# Patient Record
Sex: Female | Born: 1995 | Race: Black or African American | Hispanic: No | Marital: Married | Smoking: Never smoker
Health system: Southern US, Community
[De-identification: ages and names within clinical notes are randomized; demographics above are authoritative.]

## PROBLEM LIST (undated history)

## (undated) DIAGNOSIS — G43909 Migraine, unspecified, not intractable, without status migrainosus: Secondary | ICD-10-CM

## (undated) HISTORY — PX: WISDOM TOOTH EXTRACTION: SHX21

---
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
## 2017-02-23 ENCOUNTER — Encounter (HOSPITAL_COMMUNITY): Payer: Self-pay | Admitting: Emergency Medicine

## 2017-02-23 ENCOUNTER — Emergency Department (HOSPITAL_COMMUNITY): Payer: Self-pay

## 2017-02-23 ENCOUNTER — Emergency Department (HOSPITAL_COMMUNITY)
Admission: EM | Admit: 2017-02-23 | Discharge: 2017-02-23 | Disposition: A | Discharge status: Home / Self Care | Payer: Self-pay | Attending: Emergency Medicine | Admitting: Emergency Medicine

## 2017-02-23 DIAGNOSIS — J069 Acute upper respiratory infection, unspecified: Secondary | ICD-10-CM | POA: Insufficient documentation

## 2017-02-23 DIAGNOSIS — R079 Chest pain, unspecified: Secondary | ICD-10-CM | POA: Insufficient documentation

## 2017-02-23 LAB — CBC
HEMATOCRIT: 37.7 % (ref 36.0–46.0)
Hemoglobin: 12.6 g/dL (ref 12.0–15.0)
MCH: 26.3 pg (ref 26.0–34.0)
MCHC: 33.4 g/dL (ref 30.0–36.0)
MCV: 78.7 fL (ref 78.0–100.0)
Platelets: 213 10*3/uL (ref 150–400)
RBC: 4.79 MIL/uL (ref 3.87–5.11)
RDW: 12.8 % (ref 11.5–15.5)
WBC: 5.7 10*3/uL (ref 4.0–10.5)

## 2017-02-23 LAB — I-STAT TROPONIN, ED: TROPONIN I, POC: 0 ng/mL (ref 0.00–0.08)

## 2017-02-23 LAB — D-DIMER, QUANTITATIVE (NOT AT ARMC): D DIMER QUANT: 0.39 ug{FEU}/mL (ref 0.00–0.50)

## 2017-02-23 MED ORDER — NAPROXEN 500 MG PO TABS: 500.0000 mg | ORAL_TABLET | Freq: Two times a day (BID) | ORAL | 0 refills | Status: AC

## 2017-02-23 NOTE — ED Triage Notes (Signed)
Patient reports persistent central chest pain with SOB , nausea/emesis , chest congestion and dry cough onset Saturday this week .

## 2017-02-23 NOTE — ED Notes (Signed)
Patient transported to X-ray 

## 2017-02-23 NOTE — ED Provider Notes (Signed)
[facility]-EMERGENCY DEPT Provider Note   CSN: [id number] Arrival date & time: 02/23/17  [id number]     History   Chief Complaint Chief Complaint  Patient presents with  . Chest Pain    HPI Jacqueline Bryant is a 21 y.o. female.  Patient is a 21 year old female with no significant past medical history who presents with chest pain. She states she's had a cough for the last 5 days. It's nonproductive. She states she's been coughing so much that she's had some posttussive emesis as well. She gets short of breath during coughing spells but no shortness of breath on ambulation. She has soreness across both sides of her chest and around to her back. She states it's worse with coughing and moving. She denies any known fevers. She does have a little bit of swelling around her left ankle which she says has been intermittent over the last several weeks. No calf pain. No recent immobilization. She has an IUD in place. She denies any known fevers. She's been using NyQuil without improvement in symptoms.      Past Medical History:  Diagnosis Date  . Migraine     Patient Active Problem List   Diagnosis Date Noted  . General counselling and advice on contraception 09/20/2013  . Well woman exam 07/09/2013  . Nexplanon in place 07/09/2013  . Family history of breast cancer 07/09/2013  . Abnormal uterine bleeding (AUB) 07/09/2013  . Anemia 07/09/2013    Past Surgical History:  Procedure Laterality Date  . WISDOM TOOTH EXTRACTION      OB History    No data available       Home Medications    Prior to Admission medications   Medication Sig Start Date End Date Taking? Authorizing Provider  ibuprofen (ADVIL,MOTRIN) 200 MG tablet Take 200 mg by mouth every 6 (six) hours as needed. Pain    [provider]  naproxen (NAPROSYN) 500 MG tablet Take 1 tablet (500 mg total) by mouth 2 (two) times daily. 02/23/17   Rolan BuccoBelfi, Jashley Yellin, MD  norgestimate-ethinyl estradiol (SPRINTEC 28) 0.25-35  MG-MCG tablet Take 1 tablet by mouth daily. 12/10/13   Lanice ShirtsWren, Amy H, CNM    Family History Family History  Problem Relation Age of Onset  . Cancer Maternal Grandmother   . Breast cancer Maternal Grandmother     Social History Social History  Substance Use Topics  . Smoking status: Never Smoker  . Smokeless tobacco: Never Used  . Alcohol use No     Allergies   Patient has no known allergies.   Review of Systems Review of Systems  Constitutional: Negative for chills, diaphoresis, fatigue and fever.  HENT: Negative for congestion, rhinorrhea and sneezing.   Eyes: Negative.   Respiratory: Positive for cough and shortness of breath. Negative for chest tightness.   Cardiovascular: Positive for chest pain and leg swelling.  Gastrointestinal: Negative for abdominal pain, blood in stool, diarrhea, nausea and vomiting.  Genitourinary: Negative for difficulty urinating, flank pain, frequency and hematuria.  Musculoskeletal: Positive for back pain. Negative for arthralgias.  Skin: Negative for rash.  Neurological: Negative for dizziness, speech difficulty, weakness, numbness and headaches.     Physical Exam Updated Vital Signs BP 106/73 (BP Location: Right Arm)   Pulse 80   Temp 98 F (36.7 C) (Oral)   Resp 20   Ht 5\' 3"  (1.6 m)   Wt 63.5 kg (140 lb)   LMP 12/24/2016 Comment: pt states that she does not know why her menstrual  cycle is two months behind, no chance of pregnancy   SpO2 100%   BMI 24.80 kg/m   Physical Exam  Constitutional: She is oriented to person, place, and time. She appears well-developed and well-nourished.  HENT:  Head: Normocephalic and atraumatic.  Eyes: Pupils are equal, round, and reactive to light.  Neck: Normal range of motion. Neck supple.  Cardiovascular: Normal rate, regular rhythm and normal heart sounds.   Pulmonary/Chest: Effort normal and breath sounds normal. No respiratory distress. She has no wheezes. She has no rales. She exhibits  tenderness (Some reproducible tenderness to the center of the chest).  Abdominal: Soft. Bowel sounds are normal. There is no tenderness. There is no rebound and no guarding.  Musculoskeletal: Normal range of motion. She exhibits edema (Mild nonpitting edema around the left ankle. There is no warmth or erythema. No bony tenderness.).  Lymphadenopathy:    She has no cervical adenopathy.  Neurological: She is alert and oriented to person, place, and time.  Skin: Skin is warm and dry. No rash noted.  Psychiatric: She has a normal mood and affect.     ED Treatments / Results  Labs (all labs ordered are listed, but only abnormal results are displayed) Labs Reviewed  CBC  D-DIMER, QUANTITATIVE (NOT AT [facility])  I-STAT TROPOININ, ED    EKG  EKG Interpretation  Date/Time:  Thursday Feb 23 2017 06:51:08 EDT Ventricular Rate:  76 PR Interval:    QRS Duration: 80 QT Interval:  374 QTC Calculation: 421 R Axis:   35 Text Interpretation:  Sinus rhythm No old tracing to compare Confirmed by Rolan Bucco [phone]) on 02/23/2017 7:06:23 AM       Radiology Dg Chest 2 View  Result Date: 02/23/2017 CLINICAL DATA:  21 year old female with a history of chest pain and back pain EXAM: CHEST  2 VIEW COMPARISON:  08/07/2012 FINDINGS: The heart size and mediastinal contours are within normal limits. Both lungs are clear. The visualized skeletal structures are unremarkable. IMPRESSION: No radiographic evidence of acute cardiopulmonary disease Electronically Signed   By: Gilmer Mor D.O.   On: 02/23/2017 07:27    Procedures Procedures (including critical care time)  Medications Ordered in ED Medications - No data to display   Initial Impression / Assessment and Plan / ED Course  I have reviewed the triage vital signs and the nursing notes.  Pertinent labs & imaging results that were available during my care of the patient were reviewed by me and considered in my medical decision making (see chart  for details).     Patient presents with a cough and associated chest pain. Chest pain is reproducible. It does not sound cardiac in nature. She had some slight swelling of her ankle which sounds more chronic but given this, I did check a d-dimer which was normal. She has no hypoxia. No persistent tachycardia. There is no other suggestions of pulmonary embolus. There is no evidence of pneumonia. Her lungs are clear to auscultation. This is likely a viral URI with associated cough and chest pain related to that. She was encouraged to use Mucinex. She was given a prescription for Naprosyn for pain. Return precautions were given. She was encouraged to follow-up with her PCP if her symptoms are not improving.  Final Clinical Impressions(s) / ED Diagnoses   Final diagnoses:  Nonspecific chest pain  Viral URI    New Prescriptions New Prescriptions   NAPROXEN (NAPROSYN) 500 MG TABLET    Take 1 tablet (500 mg total)  by mouth 2 (two) times daily.     Rolan Bucco, MD 02/23/17 [phone]

## 2018-04-10 IMAGING — CR DG CHEST 2V
2 series · 2 of 2 positions shown · non-contrast
Comparison: 08/07/2012

CLINICAL DATA: 21-year-old female with a history of chest pain and
back pain

EXAM:
CHEST  2 VIEW

[chest pa]
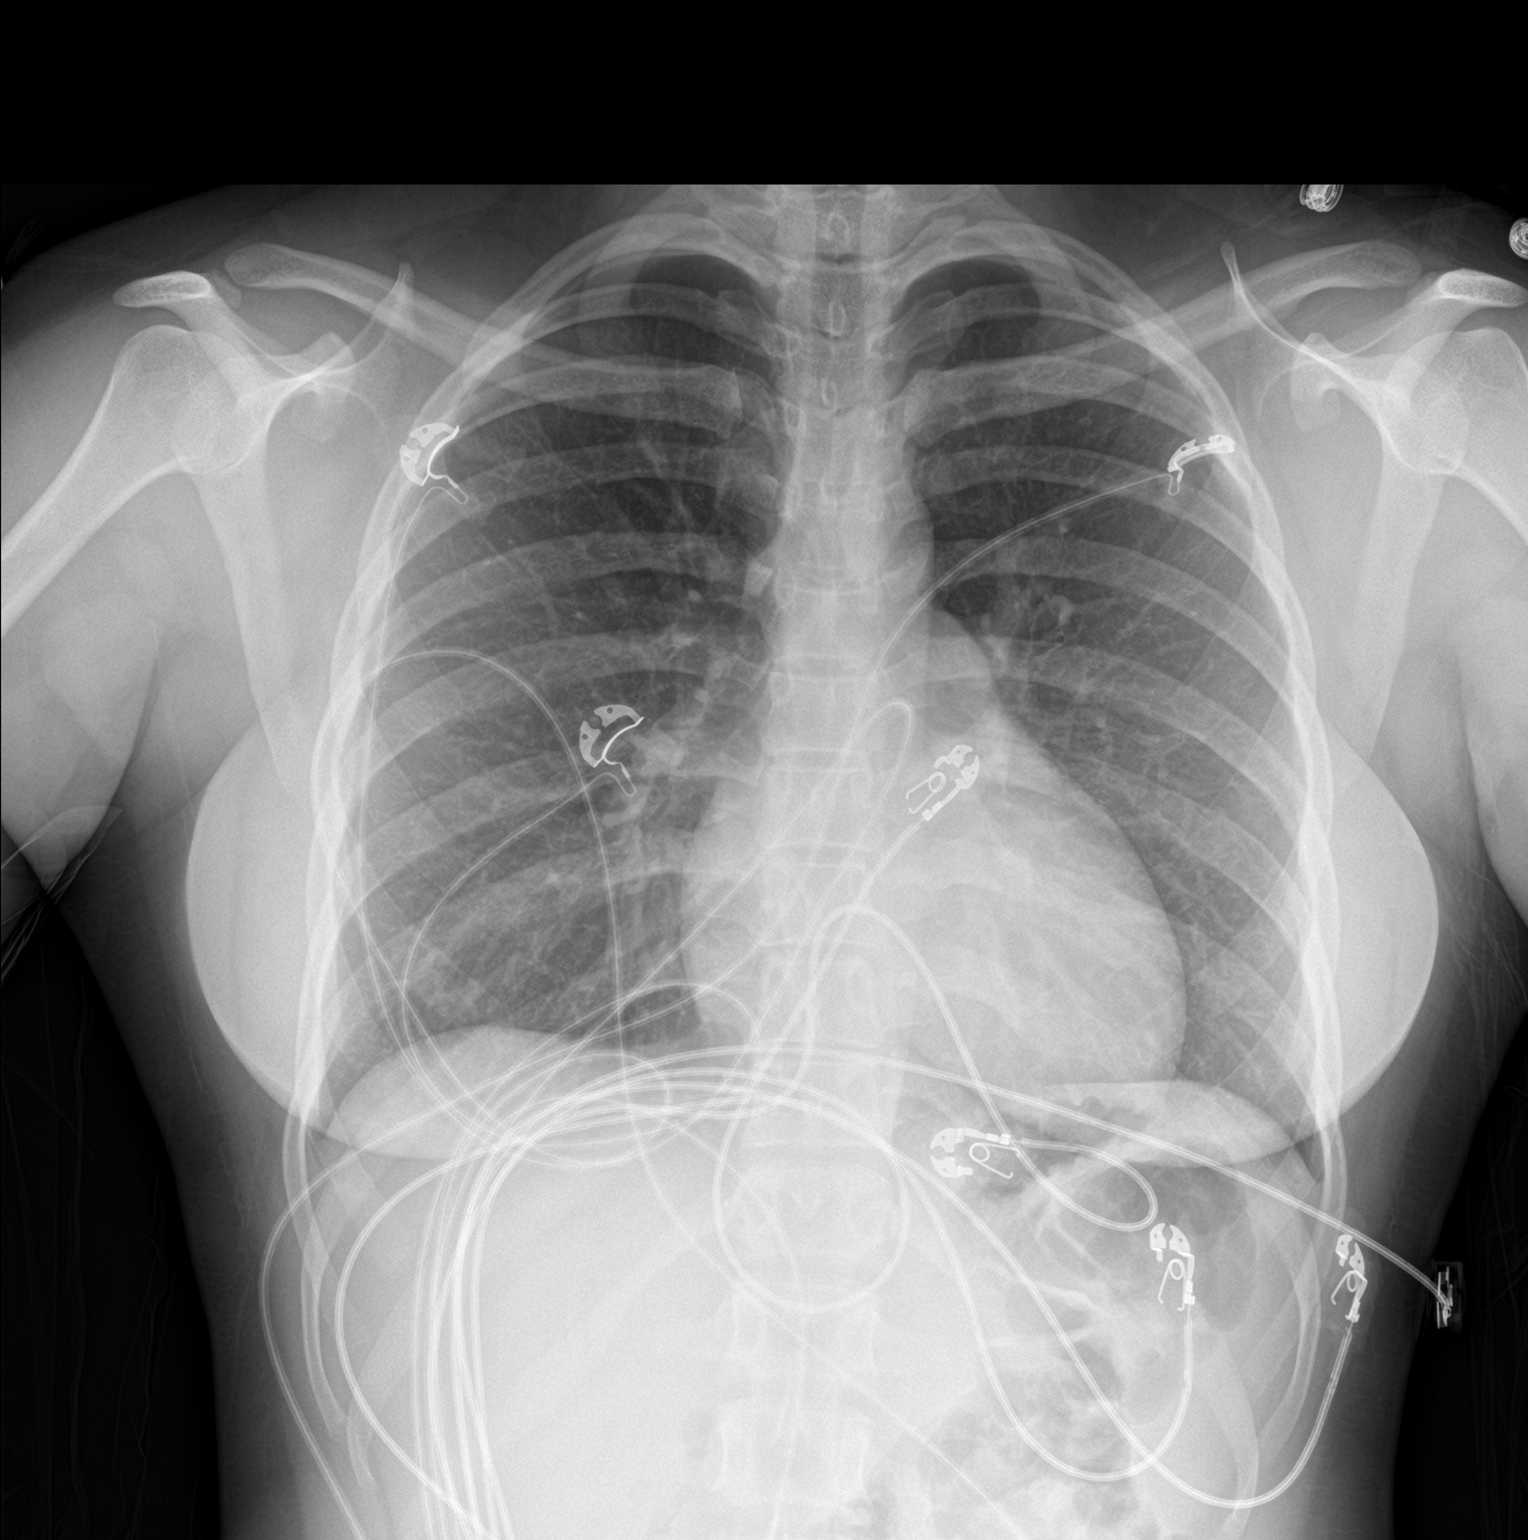

[chest lat]
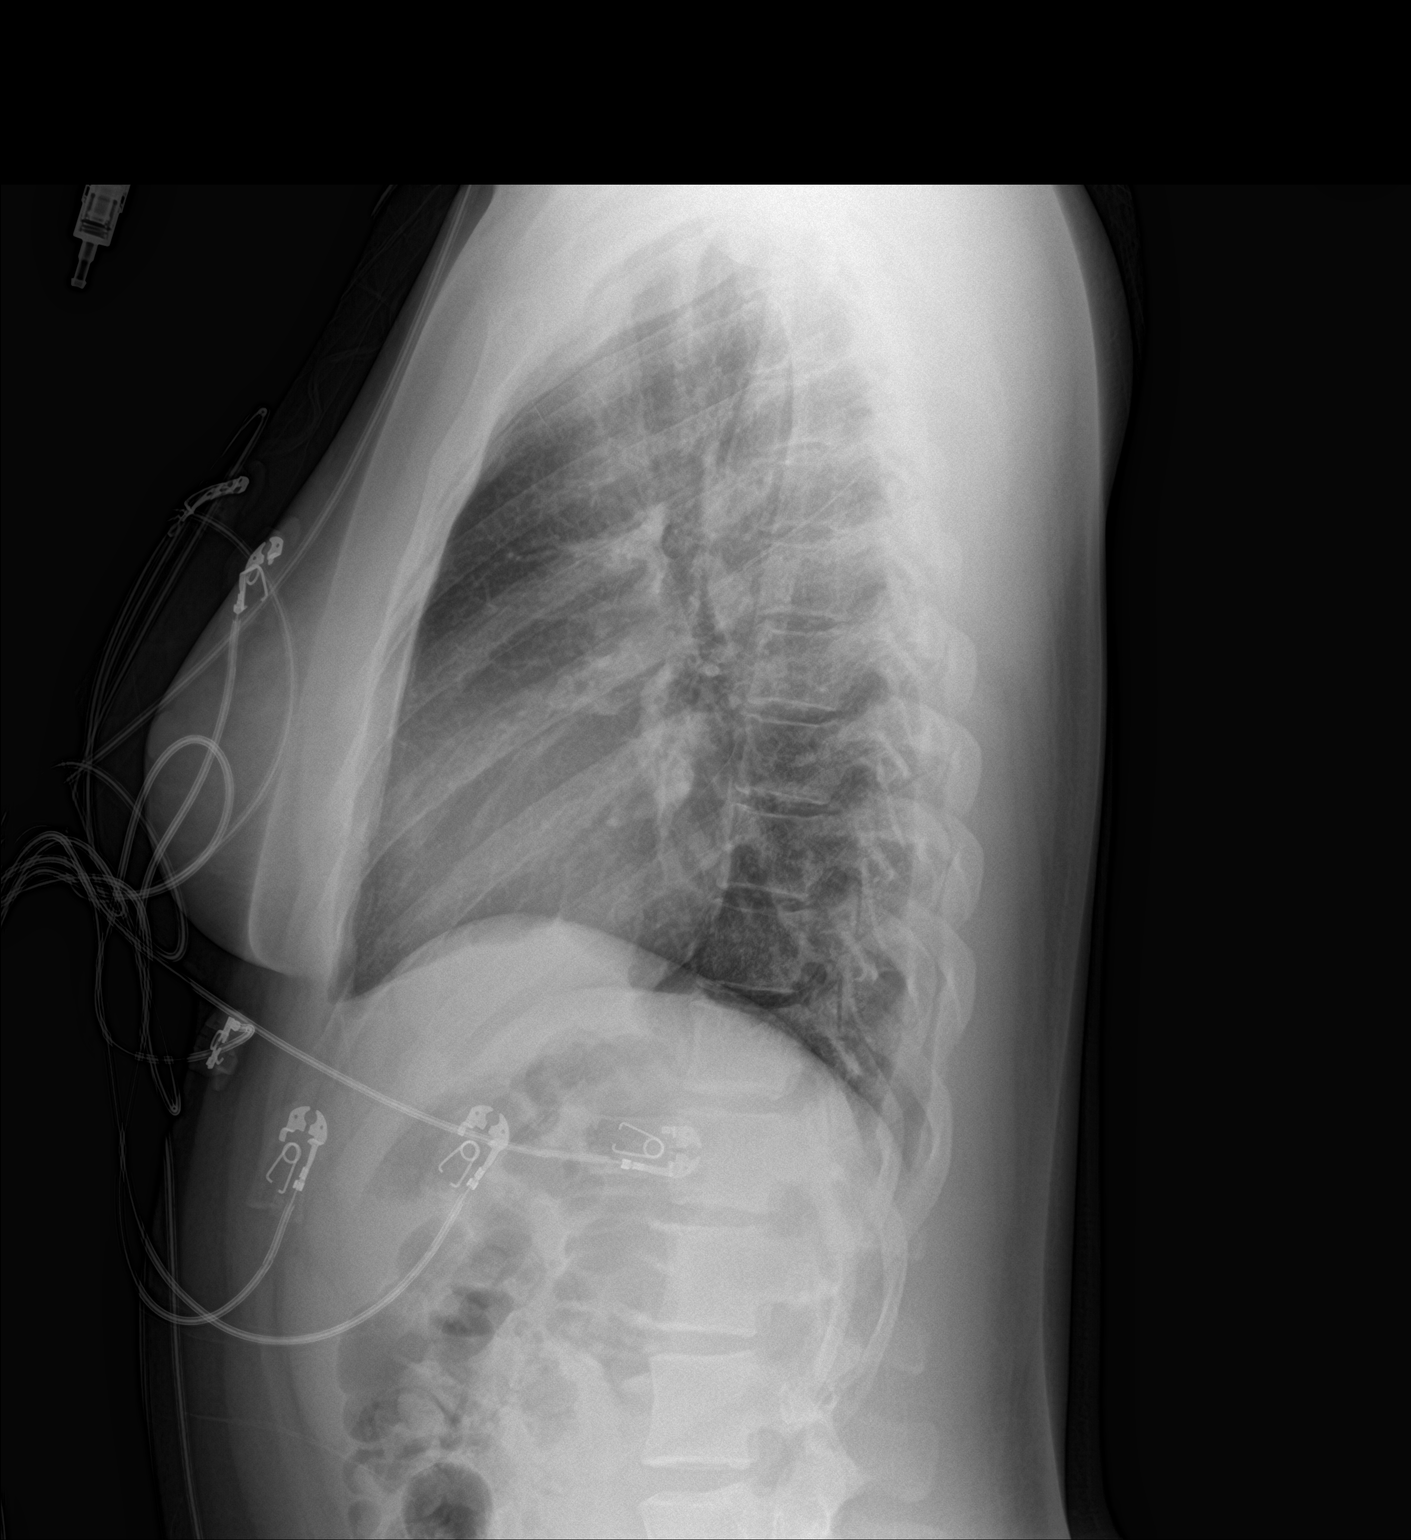

[2 of 2 positions shown; findings below may reference images not displayed]

FINDINGS: The heart size and mediastinal contours are within normal limits.
Both lungs are clear. The visualized skeletal structures are
unremarkable.
IMPRESSION: No radiographic evidence of acute cardiopulmonary disease
